# Patient Record
Sex: Male | Born: 1990 | Hispanic: Yes | Marital: Single
Health system: Southern US, Community
[De-identification: ages and names within clinical notes are randomized; demographics above are authoritative.]

---

## 2016-11-12 DIAGNOSIS — Z1389 Encounter for screening for other disorder: Secondary | ICD-10-CM | POA: Diagnosis not present

## 2016-11-12 DIAGNOSIS — Z0000 Encounter for general adult medical examination without abnormal findings: Secondary | ICD-10-CM | POA: Diagnosis not present

## 2017-04-04 DIAGNOSIS — R300 Dysuria: Secondary | ICD-10-CM | POA: Diagnosis not present

## 2017-04-04 DIAGNOSIS — J309 Allergic rhinitis, unspecified: Secondary | ICD-10-CM | POA: Diagnosis not present

## 2017-06-02 ENCOUNTER — Encounter (HOSPITAL_COMMUNITY): Payer: Self-pay | Admitting: Emergency Medicine

## 2017-06-02 ENCOUNTER — Emergency Department (HOSPITAL_COMMUNITY): Payer: 59

## 2017-06-02 ENCOUNTER — Emergency Department (HOSPITAL_COMMUNITY)
Admission: EM | Admit: 2017-06-02 | Discharge: 2017-06-03 | Disposition: A | Discharge status: Home / Self Care | Payer: 59 | Attending: Emergency Medicine | Admitting: Emergency Medicine

## 2017-06-02 DIAGNOSIS — R410 Disorientation, unspecified: Secondary | ICD-10-CM | POA: Diagnosis not present

## 2017-06-02 DIAGNOSIS — Y92410 Unspecified street and highway as the place of occurrence of the external cause: Secondary | ICD-10-CM | POA: Insufficient documentation

## 2017-06-02 DIAGNOSIS — S0181XA Laceration without foreign body of other part of head, initial encounter: Secondary | ICD-10-CM | POA: Diagnosis not present

## 2017-06-02 DIAGNOSIS — S299XXA Unspecified injury of thorax, initial encounter: Secondary | ICD-10-CM | POA: Diagnosis not present

## 2017-06-02 DIAGNOSIS — S0993XA Unspecified injury of face, initial encounter: Secondary | ICD-10-CM | POA: Diagnosis not present

## 2017-06-02 DIAGNOSIS — S01412A Laceration without foreign body of left cheek and temporomandibular area, initial encounter: Secondary | ICD-10-CM | POA: Insufficient documentation

## 2017-06-02 DIAGNOSIS — Y9389 Activity, other specified: Secondary | ICD-10-CM | POA: Diagnosis not present

## 2017-06-02 DIAGNOSIS — S0990XA Unspecified injury of head, initial encounter: Secondary | ICD-10-CM | POA: Diagnosis not present

## 2017-06-02 DIAGNOSIS — S01511A Laceration without foreign body of lip, initial encounter: Secondary | ICD-10-CM | POA: Diagnosis not present

## 2017-06-02 DIAGNOSIS — G4489 Other headache syndrome: Secondary | ICD-10-CM | POA: Diagnosis not present

## 2017-06-02 DIAGNOSIS — S3993XA Unspecified injury of pelvis, initial encounter: Secondary | ICD-10-CM | POA: Diagnosis not present

## 2017-06-02 DIAGNOSIS — Y998 Other external cause status: Secondary | ICD-10-CM | POA: Insufficient documentation

## 2017-06-02 DIAGNOSIS — S199XXA Unspecified injury of neck, initial encounter: Secondary | ICD-10-CM | POA: Diagnosis not present

## 2017-06-02 LAB — CBC WITH DIFFERENTIAL/PLATELET
Basophils Absolute: 0.2 10*3/uL — ABNORMAL HIGH (ref 0.0–0.1)
Basophils Relative: 1 %
Eosinophils Absolute: 0.1 10*3/uL (ref 0.0–0.7)
Eosinophils Relative: 1 %
HEMATOCRIT: 44.7 % (ref 39.0–52.0)
Hemoglobin: 15.2 g/dL (ref 13.0–17.0)
LYMPHS ABS: 2.7 10*3/uL (ref 0.7–4.0)
LYMPHS PCT: 22 %
MCH: 32 pg (ref 26.0–34.0)
MCHC: 34 g/dL (ref 30.0–36.0)
MCV: 94.1 fL (ref 78.0–100.0)
MONO ABS: 0.8 10*3/uL (ref 0.1–1.0)
MONOS PCT: 6 %
NEUTROS ABS: 8.4 10*3/uL — AB (ref 1.7–7.7)
Neutrophils Relative %: 70 %
Platelets: 251 10*3/uL (ref 150–400)
RBC: 4.75 MIL/uL (ref 4.22–5.81)
RDW: 12.4 % (ref 11.5–15.5)
WBC: 12.1 10*3/uL — ABNORMAL HIGH (ref 4.0–10.5)

## 2017-06-02 LAB — COMPREHENSIVE METABOLIC PANEL
ALBUMIN: 4.9 g/dL (ref 3.5–5.0)
ALK PHOS: 64 U/L (ref 38–126)
ALT: 23 U/L (ref 17–63)
AST: 27 U/L (ref 15–41)
Anion gap: 10 (ref 5–15)
BUN: 7 mg/dL (ref 6–20)
CALCIUM: 9.4 mg/dL (ref 8.9–10.3)
CO2: 25 mmol/L (ref 22–32)
CREATININE: 0.82 mg/dL (ref 0.61–1.24)
Chloride: 103 mmol/L (ref 101–111)
GFR calc non Af Amer: >60 mL/min (ref >60)
GLUCOSE: 92 mg/dL (ref 65–99)
Potassium: 3.4 mmol/L — ABNORMAL LOW (ref 3.5–5.1)
SODIUM: 138 mmol/L (ref 135–145)
Total Bilirubin: 0.8 mg/dL (ref 0.3–1.2)
Total Protein: 7.7 g/dL (ref 6.5–8.1)

## 2017-06-02 LAB — LIPASE, BLOOD: Lipase: 22 U/L (ref 11–51)

## 2017-06-02 MED ORDER — CEPHALEXIN 500 MG PO CAPS: 500.0000 mg | ORAL_CAPSULE | Freq: Four times a day (QID) | ORAL | 0 refills | Status: AC

## 2017-06-02 MED ORDER — MORPHINE SULFATE (PF) 4 MG/ML IV SOLN
INTRAVENOUS | Status: AC
Start: 2017-06-02 — End: 2017-06-02
  Administered 2017-06-02: 4 mg
  Filled 2017-06-02: qty 1

## 2017-06-02 MED ORDER — MORPHINE SULFATE (PF) 4 MG/ML IV SOLN
4.0000 mg | INTRAVENOUS | Status: AC | PRN
Start: 2017-06-02 — End: 2017-06-02
  Administered 2017-06-02: 4 mg via INTRAVENOUS
  Filled 2017-06-02: qty 1

## 2017-06-02 MED ORDER — LIDOCAINE HCL (PF) 1 % IJ SOLN
2.0000 mL | Freq: Once | INTRAMUSCULAR | Status: AC
  Administered 2017-06-02: 2 mL via INTRADERMAL

## 2017-06-02 MED ORDER — CEFAZOLIN SODIUM-DEXTROSE 1-4 GM/50ML-% IV SOLN
1.0000 g | Freq: Once | INTRAVENOUS | Status: AC
  Administered 2017-06-02: 1 g via INTRAVENOUS
  Filled 2017-06-02: qty 50
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
## 2017-06-02 NOTE — ED Notes (Signed)
ENT at bedside performing wound closure.
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
## 2017-06-02 NOTE — Discharge Instructions (Signed)
Please return to the ED if you develop fevers, redness, severe pain, or drainage. Call Dr. Kenney Housemanrab tomorrow to set up an appointment in 1 week.

## 2017-06-02 NOTE — ED Notes (Signed)
Wound cleaning performed again d/t continued bleeding of facial and scalp  wounds

## 2017-06-03 DIAGNOSIS — S01511A Laceration without foreign body of lip, initial encounter: Secondary | ICD-10-CM | POA: Diagnosis not present

## 2017-06-03 MED ORDER — HYDROCODONE-ACETAMINOPHEN 5-325 MG PO TABS
1.0000 | ORAL_TABLET | Freq: Once | ORAL | Status: AC
  Administered 2017-06-03: 1 via ORAL
  Filled 2017-06-03: qty 1

## 2017-06-03 MED ORDER — HYDROCODONE-ACETAMINOPHEN 5-325 MG PO TABS: 1.0000 | ORAL_TABLET | Freq: Four times a day (QID) | ORAL | 0 refills | Status: AC | PRN

## 2017-06-03 NOTE — Procedures (Signed)
Preoperative Dx: multiple facial/mucosal lacerations.  Postoperative Dx: s/p laceration repairs.  Procedures: 1.  Repair of superficial facial lacerations x3- totaling 4.5 cm 2.  Repair of superficial lower lip mucosal laceration 2 cm 3.  Repair of complex left commissure/vermilion/mucosal/skin laceration, 4 cm 4.  Repair of complex left nares/nasolabial stellate laceration, totaling 12 cm 5.  Wound debridement with foreign body removal x4.  Surgeon: Junita PushJustin L Ivelise Castillo, DMD  Procedure: The patient was anesthetized with 10cc of 2% lidocaine with 1:100,000 epi as a infraorbital block, as well as local infiltration include intraoral infiltration.  The wounds were thoroughly cleansed with pressure syringe saline.  Following this given the macerated edges of multiple areas of the nasolabial laceration, these edges were freshened with removal of necrotic tissue.  The facial wounds were thoroughly evaluated and explored with removal of multiple foreign bodies x4 that appeared to be glass fragments.  Following this the wounds were thoroughly irrigated.  The superficial facial lacerations were reapproximated and closed primarily with 5-0 Prolene sutures.  The left nares/nasolabial laceration was closed in multiple layers with 4-0 Vicryl sutures utilized in the deep muscle and subcutaneous tissues.  The skin was then reapproximated with multiple 5-0 Prolene sutures.  The left commissure laceration was also closed in multiple layers starting with the mucosal layer.  The vermilion edges were then reapproximated with a single 5-0 Prolene suture.  Deep 4-0 Vicryl sutures were placed to re-approximate the orbicularis oris muscle.  The skin was then closed with multiple 5-0 Prolene sutures.  The intraoral lower lip lesion was closed primarily with multiple 3-0 chromic gut sutures.  Following repairs, the wounds were cleansed again and a thin coat of Bacitracin ointment was applied. The patient tolerated the procedure  well.  Complications: None  EBL: 20cc  Disposition: 1. Plan to remove sutures in 7-10 days. 2. Please see Oral & Maxillofacial Surgery Consult note for recommendations, wound care, and follow up information.

## 2017-08-20 DIAGNOSIS — S01511D Laceration without foreign body of lip, subsequent encounter: Secondary | ICD-10-CM | POA: Diagnosis not present

## 2018-03-05 DIAGNOSIS — Z1389 Encounter for screening for other disorder: Secondary | ICD-10-CM | POA: Diagnosis not present

## 2018-03-05 DIAGNOSIS — Z0000 Encounter for general adult medical examination without abnormal findings: Secondary | ICD-10-CM | POA: Diagnosis not present

## 2018-03-05 DIAGNOSIS — Z6830 Body mass index (BMI) 30.0-30.9, adult: Secondary | ICD-10-CM | POA: Diagnosis not present

## 2018-09-30 DIAGNOSIS — R300 Dysuria: Secondary | ICD-10-CM | POA: Diagnosis not present

## 2018-10-09 DIAGNOSIS — H9201 Otalgia, right ear: Secondary | ICD-10-CM | POA: Diagnosis not present

## 2018-10-09 DIAGNOSIS — J302 Other seasonal allergic rhinitis: Secondary | ICD-10-CM | POA: Diagnosis not present

## 2018-10-09 DIAGNOSIS — R300 Dysuria: Secondary | ICD-10-CM | POA: Diagnosis not present
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
